# Patient Record
Sex: Male | Born: 1960 | Race: Black or African American | Hispanic: No | Smoking: Never smoker
Health system: Southern US, Community
[De-identification: ages and names within clinical notes are randomized; demographics above are authoritative.]

---

## 2017-07-19 ENCOUNTER — Ambulatory Visit
Admission: EM | Admit: 2017-07-19 | Discharge: 2017-07-19 | Disposition: A | Discharge status: Home / Self Care | Payer: BC Managed Care – PPO | Attending: Family Medicine | Admitting: Family Medicine

## 2017-07-19 ENCOUNTER — Other Ambulatory Visit: Payer: Self-pay

## 2017-07-19 ENCOUNTER — Ambulatory Visit (INDEPENDENT_AMBULATORY_CARE_PROVIDER_SITE_OTHER): Payer: BC Managed Care – PPO

## 2017-07-19 DIAGNOSIS — M79644 Pain in right finger(s): Secondary | ICD-10-CM

## 2017-07-19 MED ORDER — IBUPROFEN 800 MG PO TABS: 800.0000 mg | ORAL_TABLET | Freq: Three times a day (TID) | ORAL | 0 refills | Status: AC

## 2017-07-19 NOTE — ED Provider Notes (Signed)
[facility]    CSN: [id number] Arrival date & time: 07/19/17  1344  History   Chief Complaint Chief Complaint  Patient presents with  . Hand Pain   HPI  57 year old male presents with thumb pain.  Patient reports that he injured his right thumb 2 days ago.  He was working in the yard and states that a limb hit his right thumb.  Bruising.  He does report swelling around the thumb.  No reports of other locations of pain.  He reports difficulty squeezing objects.  No medications or interventions tried.  Worse with activity.  No relieving factors.  No other complaints.  Social History Social History   Tobacco Use  . Smoking status: Never Smoker  . Smokeless tobacco: Never Used  Substance Use Topics  . Alcohol use: Not Currently  . Drug use: Not Currently   Allergies   Patient has no known allergies.  Review of Systems Review of Systems  Constitutional: Negative.   Musculoskeletal:       Right thumb pain.   Physical Exam Triage Vital Signs ED Triage Vitals  Enc Vitals Group     BP 07/19/17 1402 (!) 166/101     Pulse Rate 07/19/17 1402 94     Resp 07/19/17 1402 16     Temp 07/19/17 1402 98.2 F (36.8 C)     Temp Source 07/19/17 1402 Oral     SpO2 07/19/17 1402 100 %     Weight 07/19/17 1403 150 lb (68 kg)     Height 07/19/17 1403 6' (1.829 m)     Head Circumference --      Peak Flow --      Pain Score 07/19/17 1402 3     Pain Loc --      Pain Edu? --      Excl. in GC? --    Updated Vital Signs BP (!) 166/101 (BP Location: Left Arm)   Pulse 94   Temp 98.2 F (36.8 C) (Oral)   Resp 16   Ht 6' (1.829 m)   Wt 150 lb (68 kg)   SpO2 100%   BMI 20.34 kg/m   Physical Exam  Constitutional: He is oriented to person, place, and time. He appears well-developed. No distress.  Cardiovascular: Normal rate and regular rhythm.  Pulmonary/Chest: Effort normal. No respiratory distress.  Musculoskeletal:  Right thumb -mild tenderness at the MCP joint.  Mild  swelling.  Neurological: He is alert and oriented to person, place, and time.  Psychiatric: He has a normal mood and affect. His behavior is normal.  Nursing note and vitals reviewed.  UC Treatments / Results  Labs (all labs ordered are listed, but only abnormal results are displayed) Labs Reviewed - No data to display  EKG None  Radiology Dg Finger Thumb Right  Result Date: 07/19/2017 CLINICAL DATA:  Yardwork injury to right thumb EXAM: RIGHT THUMB 2+V COMPARISON:  None. FINDINGS: There is no evidence of fracture or dislocation. There is no evidence of arthropathy or other focal bone abnormality. Soft tissues are unremarkable IMPRESSION: Negative. Electronically Signed   By: Deatra Robinson M.D.   On: 07/19/2017 14:40   Procedures Procedures (including critical care time)  Medications Ordered in UC Medications - No data to display  Initial Impression / Assessment and Plan / UC Course  I have reviewed the triage vital signs and the nursing notes.  Pertinent labs & imaging results that were available during my care of the patient were reviewed  by me and considered in my medical decision making (see chart for details).    57 year old male presents the right thumb injury.  X-ray negative.  Treating with Motrin.  Work note given.  Final Clinical Impressions(s) / UC Diagnoses   Final diagnoses:  Pain of right thumb     Discharge Instructions     Rest  Medication as prescribed.  Take care  Dr. Adriana Simas    ED Prescriptions    Medication Sig Dispense Auth. Provider   ibuprofen (ADVIL,MOTRIN) 800 MG tablet Take 1 tablet (800 mg total) by mouth 3 (three) times daily. 21 tablet Tommie Sams, DO     Controlled Substance Prescriptions [place] Controlled Substance Registry consulted? Not Applicable   Tommie Sams, DO 07/19/17 1519

## 2017-07-19 NOTE — Discharge Instructions (Signed)
Rest  Medication as prescribed.  Take care  Dr. Ena Demary  

## 2017-07-19 NOTE — ED Triage Notes (Signed)
Pt reports a limb fell onto his right hand while he was working in the yard 2 days ago. Left thumb pain and mild swelling.

## 2018-03-02 ENCOUNTER — Encounter: Payer: Self-pay | Admitting: Emergency Medicine

## 2018-03-02 ENCOUNTER — Other Ambulatory Visit: Payer: Self-pay

## 2018-03-02 ENCOUNTER — Emergency Department
Admission: EM | Admit: 2018-03-02 | Discharge: 2018-03-02 | Disposition: A | Discharge status: Home / Self Care | Payer: BC Managed Care – PPO | Attending: Emergency Medicine | Admitting: Emergency Medicine

## 2018-03-02 DIAGNOSIS — F331 Major depressive disorder, recurrent, moderate: Secondary | ICD-10-CM | POA: Diagnosis not present

## 2018-03-02 DIAGNOSIS — F32A Depression, unspecified: Secondary | ICD-10-CM

## 2018-03-02 DIAGNOSIS — F329 Major depressive disorder, single episode, unspecified: Secondary | ICD-10-CM

## 2018-03-02 LAB — COMPREHENSIVE METABOLIC PANEL
ALBUMIN: 4.6 g/dL (ref 3.5–5.0)
ALT: 22 U/L (ref 0–44)
AST: 17 U/L (ref 15–41)
Alkaline Phosphatase: 64 U/L (ref 38–126)
Anion gap: 8 (ref 5–15)
BILIRUBIN TOTAL: 0.6 mg/dL (ref 0.3–1.2)
BUN: 18 mg/dL (ref 6–20)
CO2: 27 mmol/L (ref 22–32)
Calcium: 9.1 mg/dL (ref 8.9–10.3)
Chloride: 106 mmol/L (ref 98–111)
Creatinine, Ser: 0.92 mg/dL (ref 0.61–1.24)
GFR calc Af Amer: >60 mL/min (ref >60)
GLUCOSE: 101 mg/dL — AB (ref 70–99)
Potassium: 3.8 mmol/L (ref 3.5–5.1)
SODIUM: 141 mmol/L (ref 135–145)
TOTAL PROTEIN: 8 g/dL (ref 6.5–8.1)

## 2018-03-02 LAB — CBC
HCT: 43.1 % (ref 39.0–52.0)
Hemoglobin: 14.6 g/dL (ref 13.0–17.0)
MCH: 31 pg (ref 26.0–34.0)
MCHC: 33.9 g/dL (ref 30.0–36.0)
MCV: 91.5 fL (ref 80.0–100.0)
NRBC: 0 % (ref 0.0–0.2)
PLATELETS: 253 10*3/uL (ref 150–400)
RBC: 4.71 MIL/uL (ref 4.22–5.81)
RDW: 13.5 % (ref 11.5–15.5)
WBC: 8.9 10*3/uL (ref 4.0–10.5)

## 2018-03-02 LAB — URINE DRUG SCREEN, QUALITATIVE (ARMC ONLY)
AMPHETAMINES, UR SCREEN: NOT DETECTED
BARBITURATES, UR SCREEN: NOT DETECTED
Benzodiazepine, Ur Scrn: NOT DETECTED
CANNABINOID 50 NG, UR ~~LOC~~: NOT DETECTED
COCAINE METABOLITE, UR ~~LOC~~: NOT DETECTED
MDMA (ECSTASY) UR SCREEN: NOT DETECTED
Methadone Scn, Ur: NOT DETECTED
Opiate, Ur Screen: NOT DETECTED
PHENCYCLIDINE (PCP) UR S: NOT DETECTED
TRICYCLIC, UR SCREEN: NOT DETECTED

## 2018-03-02 LAB — ETHANOL: Alcohol, Ethyl (B): <10 mg/dL (ref <10)

## 2018-03-02 MED ORDER — MIRTAZAPINE 30 MG PO TABS: 30.0000 mg | ORAL_TABLET | Freq: Every day | ORAL | 1 refills | Status: AC

## 2018-03-02 NOTE — ED Notes (Signed)

## 2018-03-02 NOTE — Discharge Instructions (Addendum)
Please seek medical attention and help for any thoughts about wanting to harm yourself, harm others, any concerning change in behavior, severe depression, inappropriate drug use or any other new or concerning symptoms. ° °

## 2018-03-02 NOTE — ED Notes (Signed)
BEHAVIORAL HEALTH ROUNDING Patient sleeping: No. Patient alert and oriented: yes Behavior appropriate: Yes.  ; If no, describe:  Nutrition and fluids offered: yes Toileting and hygiene offered: Yes  Sitter present: q15 minute observations and security  monitoring Law enforcement present: Yes  ODS  

## 2018-03-02 NOTE — ED Provider Notes (Signed)
[facility] Emergency Department Provider Note ____________________________________________   First MD Initiated Contact with Patient 03/02/18 1314     (approximate)  I have reviewed the triage vital signs and the nursing notes.   HISTORY  Chief Complaint Anxiety    HPI Celso SickleBobby Latella is a 58 y.o. male with PMH as noted below including history of depression who presents with symptoms of depression, gradual onset over months, associated with anxiety, and which she states are now interfering with his ability to do his job.  The patient states that he was evaluated last year and started on citalopram.  He states he took it for a while and felt somewhat better but then started to have side effects that he did not like including nausea and hematuria.  He states he has not taken it in several months.  The [facility] and had an assessment.  An appointment was set for late next month but the patient states that his symptoms are worsening and he cannot wait this long to be put back on medication.  The patient denies SI or HI.  Denies any hallucinations.  He states he does not take any drugs or drink alcohol though he used to drink heavily.  History reviewed. No pertinent past medical history.  There are no active problems to display for this patient.   History reviewed. No pertinent surgical history.  Prior to Admission medications   Medication Sig Start Date End Date Taking? Authorizing Provider  ibuprofen (ADVIL,MOTRIN) 800 MG tablet Take 1 tablet (800 mg total) by mouth 3 (three) times daily. 07/19/17   Tommie Samsook, Jayce G, DO    Allergies Patient has no known allergies.  No family history on file.  Social History Social History   Tobacco Use  . Smoking status: Never Smoker  . Smokeless tobacco: Never Used  Substance Use Topics  . Alcohol use: Not Currently  . Drug use: Not Currently    Review of Systems  Constitutional: No fever. Eyes: No  redness. ENT: No sore throat. Cardiovascular: Denies chest pain. Respiratory: Denies shortness of breath. Gastrointestinal: No vomiting or diarrhea.  Genitourinary: Negative for dysuria.  Musculoskeletal: Negative for back pain. Skin: Negative for rash. Neurological: Negative for headache.   ____________________________________________   PHYSICAL EXAM:  VITAL SIGNS: ED Triage Vitals  Enc Vitals Group     BP 03/02/18 1247 (!) 168/92     Pulse Rate 03/02/18 1247 91     Resp 03/02/18 1247 18     Temp 03/02/18 1247 98 F (36.7 C)     Temp Source 03/02/18 1247 Oral     SpO2 03/02/18 1247 100 %     Weight 03/02/18 1248 160 lb (72.6 kg)     Height 03/02/18 1248 6' (1.829 m)     Head Circumference --      Peak Flow --      Pain Score 03/02/18 1248 0     Pain Loc --      Pain Edu? --      Excl. in GC? --     Constitutional: Alert and oriented. Well appearing and in no acute distress. Eyes: Conjunctivae are normal.  Head: Atraumatic. Nose: No congestion/rhinnorhea. Mouth/Throat: Mucous membranes are moist.   Neck: Normal range of motion.  Cardiovascular: Good peripheral circulation. Respiratory: Normal respiratory effort.   Gastrointestinal: No distention.  Musculoskeletal: Extremities warm and well perfused.  Neurologic:  Normal speech and language. No gross focal neurologic deficits are appreciated.  Skin:  Skin is warm and dry. No rash noted. Psychiatric: Somewhat flat affect and depressed mood.  Speech and behavior are normal.  ____________________________________________   LABS (all labs ordered are listed, but only abnormal results are displayed)  Labs Reviewed  COMPREHENSIVE METABOLIC PANEL - Abnormal; Notable for the following components:      Result Value   Glucose, Bld 101 (*)    All other components within normal limits  ETHANOL  CBC  URINE DRUG SCREEN, QUALITATIVE ([facility] ONLY)    ____________________________________________  EKG   ____________________________________________  RADIOLOGY    ____________________________________________   PROCEDURES  Procedure(s) performed: No  Procedures  Critical Care performed: No ____________________________________________   INITIAL IMPRESSION / ASSESSMENT AND PLAN / ED COURSE  Pertinent labs & imaging results that were available during my care of the patient were reviewed by me and considered in my medical decision making (see chart for details).  58 year old male with PMH as noted above presents with symptoms of depression, gradual onset, and worsening in the last few months.  The patient was previously on citalopram but states he did not like the side effects.  He has not taken medication in some months.  He had an outpatient assessment [facility] but the appointment was set for late February and the patient states that his symptoms are worsening and he feels like he might lose his job so he states he cannot wait that long to be seen.  He denies SI or HI.  On exam the patient is comfortable appearing.  He is hypertensive but his other vital signs are normal.  The remainder of the exam is unremarkable.  At this time the patient does not demonstrate acute danger to self or others.  He does not require involuntary commitment or inpatient admission.  However I think he will benefit from psychiatry consultation to evaluate him and possibly start him on an antidepressant sooner than next month.  ----------------------------------------- 3:10 PM on 03/02/2018 -----------------------------------------  I consulted Dr. Zonia Kief from psychiatry.  I am signing the patient out to the oncoming physician Dr. Derrill Kay.  ____________________________________________   FINAL CLINICAL IMPRESSION(S) / ED DIAGNOSES  Final diagnoses:  Depression, unspecified depression type      NEW MEDICATIONS STARTED DURING THIS VISIT:  New  Prescriptions   No medications on file     Note:  This document was prepared using Dragon voice recognition software and may include unintentional dictation errors.    Dionne Bucy, MD 03/02/18 1510

## 2018-03-02 NOTE — ED Triage Notes (Signed)
States has been feeling "stressed".  When questioned for how long cannot states but when questioned has it been for months states yes. States used to take medication for this but that he stopped it because he does not like the side effects. Has citalopram bottle but states has not taken for a year. Denies SI.

## 2018-03-02 NOTE — Consult Note (Signed)
[facility] Face-to-Face Psychiatry Consult   Reason for Consult:  depression Referring Physician:  Dr. Marisa Severin Patient Identification: Mitchell Gates MRN:  [id number] Principal Diagnosis: Depression, major, recurrent, moderate (HCC) Diagnosis:  Principal Problem:   Depression, major, recurrent, moderate (HCC)   Total Time spent with patient: 1 hour  Subjective:   Mitchell Gates is a 58 y.o. male who presented to the emergency room requesting to be started on antidepressant medication.  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  They have been short staffed and he has been having to work additional shifts which is added to his stress.  He was started on Celexa in 2018 took it for a few months but did not like the chronic nausea that he experienced.  He has been off the Celexa for several months.  He did state that helped keep his nerves bit more calm.  HPI: Several months of worsening depression and some anxiety.  Probably related to stress at work.  Patient denies any suicidal ideation.  He states he has had thoughts about suicide in the distant past but has never made any suicide attempts and states he would never actually attempt.  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  He got a Hotel manager stop drinking but did remain depressed has never really sought much treatment into the last couple of years.  He states he benefited from therapy for a while and actually has already set up an to start seeing his therapist again on Tuesday, March 05, 2018.  He also called for an appointment to see his psychiatrist that he seen previously at [facility] however the next available appointment is April 22, 2018.  He has become aware that his job  performance might suffer.  He is talked with his supervisor.  He has been told that they work with him however he is gotten concerned that due to anxiety depression symptoms are better started in therapy before February 27.  We discussed getting started and then seeing his therapist.  He requested having a week off work, I will provide a out of work note.  Risk to Self:  No  risk to Others:  no Prior Inpatient Therapy:  no Prior Outpatient Therapy:   yes Past Medical History: History reviewed. No pertinent past medical history. History reviewed. No pertinent surgical history. Family History: No family history on file. Family Psychiatric  History:  Social History: Divorced with 3 grown children currently works as a Public relations account executive.  He does have a gun at home which is part of his job, he states he has no temptation to use a gun and has never considered actually committing suicide Social History   Substance and Sexual Activity  Alcohol Use Not Currently     Social History   Substance and Sexual Activity  Drug Use Not Currently    Social History   Socioeconomic History  . Marital status: Legally Separated    Spouse name: Not on file  . Number of children: Not on file  . Years of education: Not on file  . Highest education level: Not on file  Occupational History  . Not on file  Social Needs  . Financial resource strain: Not on file  . Food insecurity:  Worry: Not on file    Inability: Not on file  . Transportation needs:    Medical: Not on file    Non-medical: Not on file  Tobacco Use  . Smoking status: Never Smoker  . Smokeless tobacco: Never Used  Substance and Sexual Activity  . Alcohol use: Not Currently  . Drug use: Not Currently  . Sexual activity: Not on file  Lifestyle  . Physical activity:    Days per week: Not on file    Minutes per session: Not on file  . Stress: Not on file  Relationships  . Social connections:    Talks on phone: Not on file     Gets together: Not on file    Attends religious service: Not on file    Active member of club or organization: Not on file    Attends meetings of clubs or organizations: Not on file    Relationship status: Not on file  Other Topics Concern  . Not on file  Social History Narrative  . Not on file   Additional Social History:    Allergies:  No Known Allergies  Labs:  Results for orders placed or performed during the hospital encounter of 03/02/18 (from the past 48 hour(s))  Comprehensive metabolic panel     Status: Abnormal   Collection Time: 03/02/18 12:58 PM  Result Value Ref Range   Sodium 141 135 - 145 mmol/L   Potassium 3.8 3.5 - 5.1 mmol/L   Chloride 106 98 - 111 mmol/L   CO2 27 22 - 32 mmol/L   Glucose, Bld 101 (H) 70 - 99 mg/dL   BUN 18 6 - 20 mg/dL   Creatinine, Ser 1.610.92 0.61 - 1.24 mg/dL   Calcium 9.1 8.9 - 09.610.3 mg/dL   Total Protein 8.0 6.5 - 8.1 g/dL   Albumin 4.6 3.5 - 5.0 g/dL   AST 17 15 - 41 U/L   ALT 22 0 - 44 U/L   Alkaline Phosphatase 64 38 - 126 U/L   Total Bilirubin 0.6 0.3 - 1.2 mg/dL   GFR calc non Af Amer >60 >60 mL/min   GFR calc Af Amer >60 >60 mL/min   Anion gap 8 5 - 15    Comment: Performed at [facility], [address] [place], [place] [address]  Ethanol     Status: None   Collection Time: 03/02/18 12:58 PM  Result Value Ref Range   Alcohol, Ethyl (B) <10 <10 mg/dL    Comment: (NOTE) Lowest detectable limit for serum alcohol is 10 mg/dL. For medical purposes only. Performed at [facility], [address] [place], [place] [address]   cbc     Status: None   Collection Time: 03/02/18 12:58 PM  Result Value Ref Range   WBC 8.9 4.0 - 10.5 K/uL   RBC 4.71 4.22 - 5.81 MIL/uL   Hemoglobin 14.6 13.0 - 17.0 g/dL   HCT 14.743.1 82.939.0 - 56.252.0 %   MCV 91.5 80.0 - 100.0 fL   MCH 31.0 26.0 - 34.0 pg   MCHC 33.9 30.0 - 36.0 g/dL   RDW 13.013.5 86.511.5 - 78.415.5 %   Platelets 253 150 - 400 K/uL   nRBC 0.0 0.0 - 0.2 %    Comment:  Performed at [facility], [address] [place], [place] [address]  Urine Drug Screen, Qualitative     Status: None   Collection Time: 03/02/18 12:59 PM  Result Value Ref Range   Tricyclic, Ur  Screen NONE DETECTED NONE DETECTED   Amphetamines, Ur Screen NONE DETECTED NONE DETECTED   MDMA (Ecstasy)Ur Screen NONE DETECTED NONE DETECTED   Cocaine Metabolite,Ur [place] NONE DETECTED NONE DETECTED   Opiate, Ur Screen NONE DETECTED NONE DETECTED   Phencyclidine (PCP) Ur S NONE DETECTED NONE DETECTED   Cannabinoid 50 Ng, Ur [place] NONE DETECTED NONE DETECTED   Barbiturates, Ur Screen NONE DETECTED NONE DETECTED   Benzodiazepine, Ur Scrn NONE DETECTED NONE DETECTED   Methadone Scn, Ur NONE DETECTED NONE DETECTED    Comment: (NOTE) Tricyclics + metabolites, urine    Cutoff 1000 ng/mL Amphetamines + metabolites, urine  Cutoff 1000 ng/mL MDMA (Ecstasy), urine              Cutoff 500 ng/mL Cocaine Metabolite, urine          Cutoff 300 ng/mL Opiate + metabolites, urine        Cutoff 300 ng/mL Phencyclidine (PCP), urine         Cutoff 25 ng/mL Cannabinoid, urine                 Cutoff 50 ng/mL Barbiturates + metabolites, urine  Cutoff 200 ng/mL Benzodiazepine, urine              Cutoff 200 ng/mL Methadone, urine                   Cutoff 300 ng/mL The urine drug screen provides only a preliminary, unconfirmed analytical test result and should not be used for non-medical purposes. Clinical consideration and professional judgment should be applied to any positive drug screen result due to possible interfering substances. A more specific alternate chemical method must be used in order to obtain a confirmed analytical result. Gas chromatography / mass spectrometry (GC/MS) is the preferred confirmat ory method. Performed at [facility], [address] [place], [place] [address]     No current facility-administered medications for this encounter.    Current Outpatient Medications   Medication Sig Dispense Refill  . ibuprofen (ADVIL,MOTRIN) 800 MG tablet Take 1 tablet (800 mg total) by mouth 3 (three) times daily. 21 tablet 0    Musculoskeletal: Strength & Muscle Tone: within normal limits Gait & Station: normal Patient leans: N/A  Psychiatric Specialty Exam: Physical Exam  ROS  Blood pressure (!) 168/92, pulse 91, temperature 98 F (36.7 C), temperature source Oral, resp. rate 18, height 6' (1.829 m), weight 72.6 kg, SpO2 100 %.Body mass index is 21.7 kg/m.  General Appearance: Well Groomed  Eye Contact:  Good  Speech:  Normal Rate  Volume:  Normal  Mood:  Depressed  Affect:  Congruent  Thought Process:  Coherent  Orientation:  Full (Time, Place, and Person)  Thought Content:  Logical  Suicidal Thoughts:  No  Homicidal Thoughts:  No  Memory:  Immediate;   Good Recent;   Good Remote;   Good  Judgement:  Good  Insight:  Good  Psychomotor Activity:  Normal  Concentration:  Concentration: Good and Attention Span: Good  Recall:  Good  Fund of Knowledge:  Good  Language:  Good  Akathisia:  No  Handed:  Right  AIMS (if indicated):     Assets:  Communication Skills Desire for Improvement Financial Resources/Insurance Housing Physical Health Social Support Transportation Vocational/Educational  ADL's:  Intact  Cognition:  WNL  Sleep:        Treatment Plan Summary: Medication management and Plan Remeron 30 mg tablet one half nightly for 4 nights  then 1 nightly #30, 1 refill.  He has appointment with his former therapist on 03/05/2018 and to see his psychiatrist on 04/22/2018.  Disposition: No evidence of imminent risk to self or others at present.   Supportive therapy provided about ongoing stressors. Discussed crisis plan, support from social network, calling 911, coming to the Emergency Department, and calling Suicide Hotline.  Terance HartWayland C Rosela Supak, MD 03/02/2018 4:41 PM

## 2018-03-02 NOTE — ED Provider Notes (Signed)
Dr. Zonia Kief with psychiatry has evaluated the patient. Did send prescription to patient's pharmacy. Does not think patient requires inpatient admission at this time.    Phineas Semen, MD 03/02/18 [phone]

## 2019-06-06 IMAGING — CR DG FINGER THUMB 2+V*R*
3 series · 3 of 3 positions shown · non-contrast
Comparison: None.

CLINICAL DATA: Yardwork injury to right thumb

EXAM:
RIGHT THUMB 2+V

[finger ap]
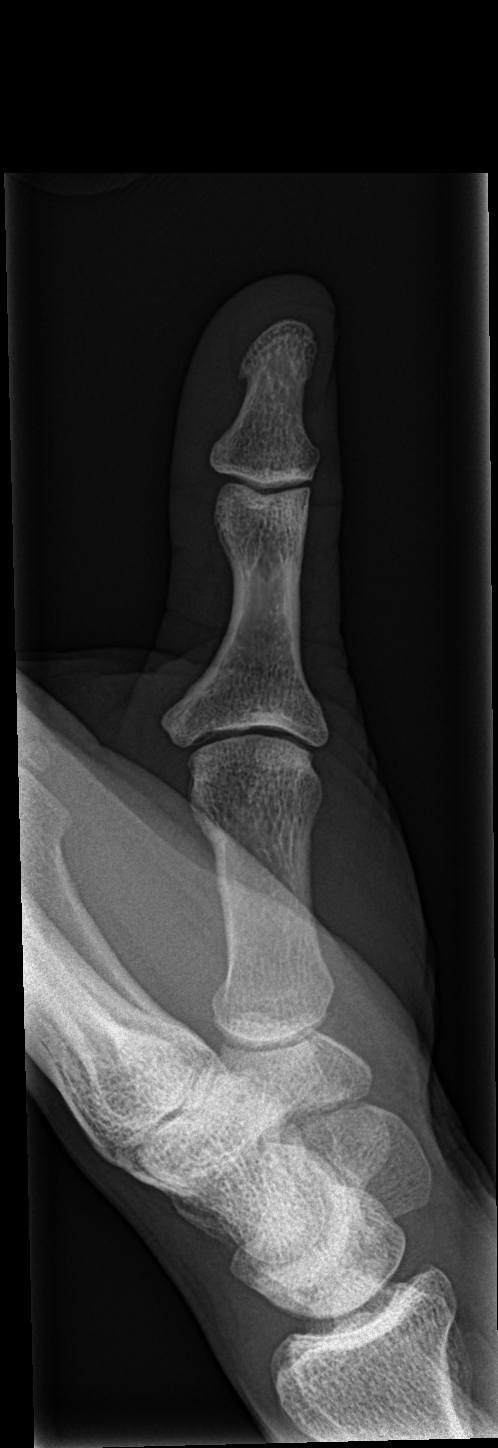

[finger obl]
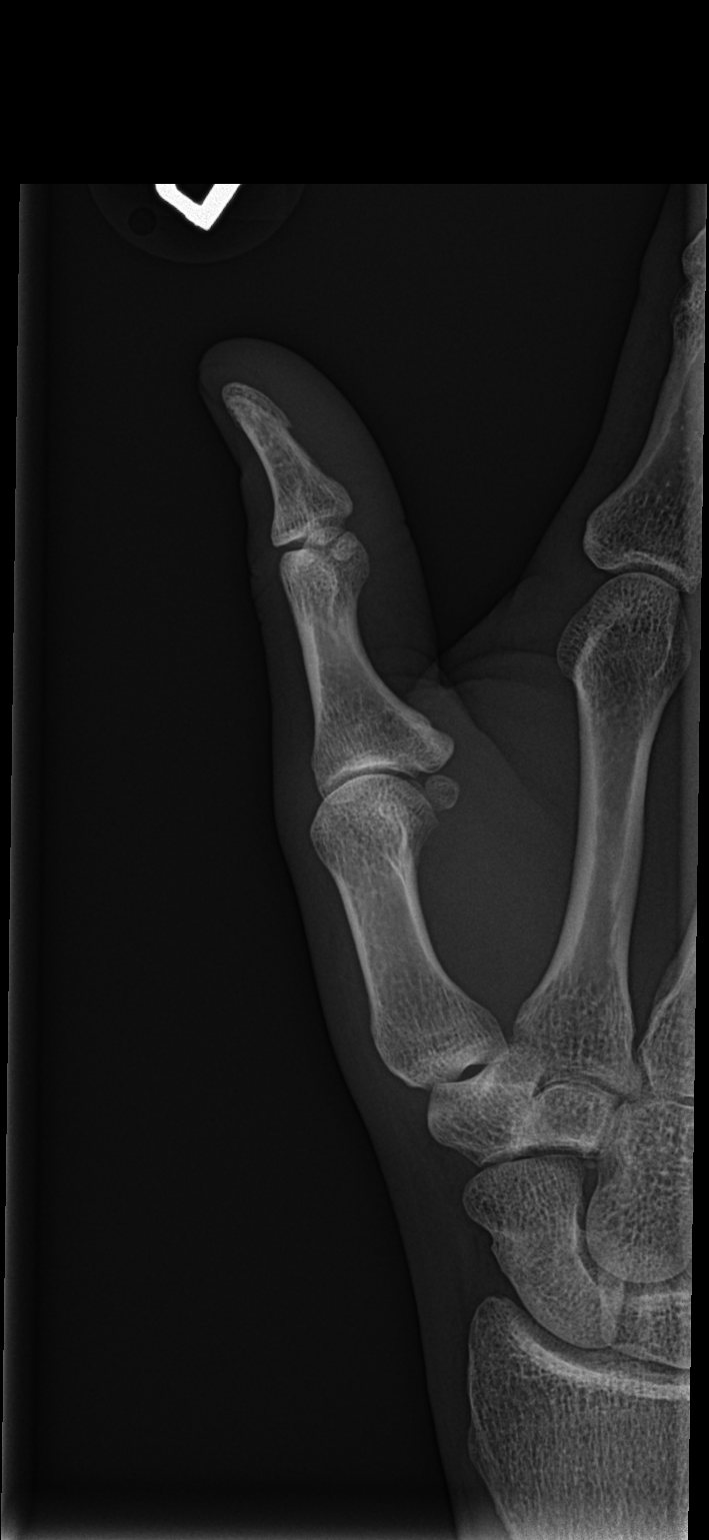

[finger lat]
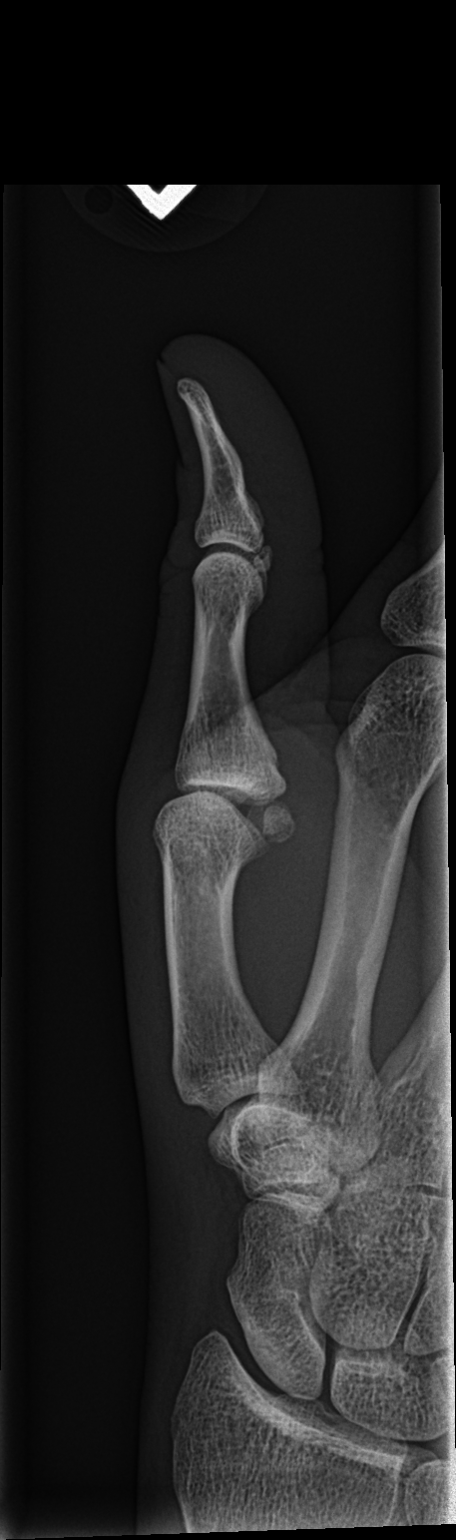

[3 of 3 positions shown; findings below may reference images not displayed]

FINDINGS: There is no evidence of fracture or dislocation. There is no
evidence of arthropathy or other focal bone abnormality. Soft
tissues are unremarkable
IMPRESSION: Negative.
